# Patient Record
Sex: Male | Born: 1975 | Race: White | Hispanic: No | Smoking: Former smoker
Health system: Southern US, Community
[De-identification: ages and names within clinical notes are randomized; demographics above are authoritative.]

## PROBLEM LIST (undated history)

## (undated) DIAGNOSIS — F419 Anxiety disorder, unspecified: Secondary | ICD-10-CM

## (undated) DIAGNOSIS — E785 Hyperlipidemia, unspecified: Secondary | ICD-10-CM

## (undated) DIAGNOSIS — K219 Gastro-esophageal reflux disease without esophagitis: Secondary | ICD-10-CM

## (undated) HISTORY — DX: Anxiety disorder, unspecified: F41.9

## (undated) HISTORY — DX: Gastro-esophageal reflux disease without esophagitis: K21.9

## (undated) HISTORY — DX: Hyperlipidemia, unspecified: E78.5

---

## 2015-12-15 ENCOUNTER — Ambulatory Visit
Admission: EM | Admit: 2015-12-15 | Discharge: 2015-12-15 | Disposition: A | Discharge status: Home / Self Care | Payer: BLUE CROSS/BLUE SHIELD | Attending: Family Medicine | Admitting: Family Medicine

## 2015-12-15 ENCOUNTER — Encounter: Payer: Self-pay | Admitting: *Deleted

## 2015-12-15 DIAGNOSIS — R42 Dizziness and giddiness: Secondary | ICD-10-CM | POA: Diagnosis present

## 2015-12-15 DIAGNOSIS — F064 Anxiety disorder due to known physiological condition: Secondary | ICD-10-CM | POA: Diagnosis not present

## 2015-12-15 DIAGNOSIS — R0789 Other chest pain: Secondary | ICD-10-CM | POA: Diagnosis not present

## 2015-12-15 DIAGNOSIS — F410 Panic disorder [episodic paroxysmal anxiety] without agoraphobia: Secondary | ICD-10-CM

## 2015-12-15 MED ORDER — ASPIRIN 81 MG PO CHEW
324.0000 mg | CHEWABLE_TABLET | Freq: Once | ORAL | Status: AC
  Administered 2015-12-15: 324 mg via ORAL

## 2015-12-15 NOTE — ED Triage Notes (Signed)
Patient started having symptom of dizziness while driving today. No other related symptoms. Patient states that he has had panic attack 2 weeks ago. Patient does not take medication.

## 2015-12-15 NOTE — ED Provider Notes (Signed)
[facility]    CSN: [id number] Arrival date & time: 12/15/15  1100 [id number] First Provider Contact:  First MD Initiated Contact with Patient 12/15/15 1207        History   Chief Complaint Chief Complaint  Patient presents with  . Dizziness    HPI Travis Howard is a 40 y.o. male.   Patient is a 40 year old white male who comes in because of chest tightness and dizziness and discomfort in his left shoulder. He states that he has noticed increased panic attacks over the last several weeks/months but nothing like this today. He was driving he felt like he was going to die and had to pull over on the highway. He was driving with a friend from work is going to do some work related business and he took over and drove him here. He states he feels much better now but is noticed increase of these attacks with today being the worst. Why he has discomfort in left shoulder he doesn't report any other movement of this discomfort and reports this is more for chest tightness and neck for chest pain. He does not smoke does not have a history of hypertension or other medical problems there is a history though sudden death of his grandfather who was in his 5850s be dropped dead of sudden heart attack.   The history is provided by the patient. No language interpreter was used.  Chest Pain  Pain location:  Unable to specify Pain quality: radiating   Pain radiates to:  L shoulder Pain severity:  Mild Onset quality:  Sudden Duration:  2 hours Timing:  Intermittent Progression:  Waxing and waning Chronicity:  Recurrent Context: breathing and stress   Context: not drug use, not eating, not intercourse, not lifting and not movement   Relieved by:  Nothing Worsened by:  Nothing Ineffective treatments:  None tried Associated symptoms: fatigue and shortness of breath     History reviewed. No pertinent past medical history.  There are no active problems to display for this patient.   History  reviewed. No pertinent surgical history.     Home Medications    Prior to Admission medications   Not on File    Family History History reviewed. No pertinent family history.  Social History Social History  Substance Use Topics  . Smoking status: Former Games developermoker  . Smokeless tobacco: Never Used  . Alcohol use Yes     Allergies   Review of patient's allergies indicates no known allergies.   Review of Systems Review of Systems  Constitutional: Positive for fatigue.  Respiratory: Positive for shortness of breath.   Cardiovascular: Positive for chest pain.  Gastrointestinal: Negative.   Genitourinary: Negative.   Musculoskeletal: Negative.   Skin: Negative.   Neurological: Negative.   Hematological: Negative.   Psychiatric/Behavioral: Negative.   All other systems reviewed and are negative.    Physical Exam Triage Vital Signs ED Triage Vitals  Enc Vitals Group     BP 12/15/15 1118 (!) 142/117     Pulse Rate 12/15/15 1118 86     Resp 12/15/15 1118 18     Temp 12/15/15 1118 97.7 F (36.5 C)     Temp Source 12/15/15 1118 Oral     SpO2 12/15/15 1118 99 %     Weight 12/15/15 1119 225 lb (102.1 kg)     Height 12/15/15 1119 6\' 1"  (1.854 m)     Head Circumference --      Peak Flow --  Pain Score 12/15/15 1122 0     Pain Loc --      Pain Edu? --      Excl. in GC? --    No data found.   Updated Vital Signs BP (!) 142/117 (BP Location: Left Arm)   Pulse 86   Temp 97.7 F (36.5 C) (Oral)   Resp 18   Ht 6\' 1"  (1.854 m)   Wt 225 lb (102.1 kg)   SpO2 99%   BMI 29.69 kg/m   Visual Acuity Right Eye Distance:   Left Eye Distance:   Bilateral Distance:    Right Eye Near:   Left Eye Near:    Bilateral Near:     Physical Exam  Constitutional: He is oriented to person, place, and time. He appears well-developed and well-nourished.  HENT:  Head: Normocephalic and atraumatic.  Right Ear: Hearing, tympanic membrane, external ear and ear canal normal.    Left Ear: Hearing, tympanic membrane, external ear and ear canal normal.  Nose: Nose normal.  Mouth/Throat: Uvula is midline and oropharynx is clear and moist.  Eyes: EOM and lids are normal. Pupils are equal, round, and reactive to light.  Neck: Normal range of motion. Neck supple.  Cardiovascular: Normal rate and regular rhythm.   Pulmonary/Chest: Effort normal and breath sounds normal.  Abdominal: Soft.  Musculoskeletal: Normal range of motion. He exhibits no edema, tenderness or deformity.  Neurological: He is alert and oriented to person, place, and time. No cranial nerve deficit.  Skin: Skin is warm and dry.  Psychiatric: He has a normal mood and affect.  Vitals reviewed.    UC Treatments / Results  Labs (all labs ordered are listed, but only abnormal results are displayed) Labs Reviewed - No data to display  EKG  EKG Interpretation None       Radiology No results found.  Procedures Procedures (including critical care time)  Medications Ordered in UC Medications  aspirin chewable tablet 324 mg (324 mg Oral Given 12/15/15 1224)    ED ECG REPORT I, Jasmarie Coppock H, the attending physician, personally viewed and interpreted this ECG.   Date: 12/15/2015  EKG Time:11:21:52  Normal sinus rhythm with sinus arrhythmia essentially normal EKG no EKG to compare with  Axis: 83  Intervals:none  ST&T Change:none  Initial Impression / Assessment and Plan / UC Course  I have reviewed the triage vital signs and the nursing notes.  Pertinent labs & imaging results that were available during my care of the patient were reviewed by me and considered in my medical decision making (see chart for details).  Clinical Course    Patient is here with Travis Howard thinks the panic attack I agree with him that I think that this saw panic attacks days having. However we do have risk factors he's almost [age] and a male and he does have a history sudden death in his family. Explained to him what  I can tell my here is that he's feeling better and his EKG is normal. However this is an urgent care and if he wants full cardiac evaluation which sometimes people with anxiety and panic attacks "we need to get him to the ED of his choice. [place] lives in [place] when he comes up from [place] wanted to do work so he wants to go to the ED in [place]. All further and plans for expectant and plans [facility] and he has declined EMS transport.  We'll give him 4 baby aspirin and we have notified ordered rather tented to notify [facility] of patient's imminent arrival thru the transfer center unsuccessfully  Final Clinical Impressions(s) / UC Diagnoses   Final diagnoses:  Feeling of chest tightness  Anxiety disorder due to general medical condition with panic attack    New Prescriptions There are no discharge medications for this patient.    Hassan Rowan, MD 12/15/15 1239

## 2015-12-15 NOTE — ED Notes (Signed)
Spoke to [employer]Josh Charge Nurse and informed him that this patient is on his way for a cardiac assessment

## 2018-03-25 ENCOUNTER — Ambulatory Visit: Payer: Self-pay | Admitting: Family Medicine

## 2018-03-25 ENCOUNTER — Ambulatory Visit: Payer: BLUE CROSS/BLUE SHIELD | Admitting: Family Medicine

## 2018-03-25 ENCOUNTER — Encounter: Payer: Self-pay | Admitting: Family Medicine

## 2018-03-25 VITALS — BP 150/100 | HR 95 | Temp 98.0°F | Resp 16 | Ht 73.0 in | Wt 253.0 lb

## 2018-03-25 DIAGNOSIS — I10 Essential (primary) hypertension: Secondary | ICD-10-CM

## 2018-03-25 DIAGNOSIS — Z7689 Persons encountering health services in other specified circumstances: Secondary | ICD-10-CM | POA: Diagnosis not present

## 2018-03-25 DIAGNOSIS — R29818 Other symptoms and signs involving the nervous system: Secondary | ICD-10-CM | POA: Diagnosis not present

## 2018-03-25 DIAGNOSIS — K219 Gastro-esophageal reflux disease without esophagitis: Secondary | ICD-10-CM | POA: Diagnosis not present

## 2018-03-25 DIAGNOSIS — F411 Generalized anxiety disorder: Secondary | ICD-10-CM

## 2018-03-25 DIAGNOSIS — F410 Panic disorder [episodic paroxysmal anxiety] without agoraphobia: Secondary | ICD-10-CM

## 2018-03-25 DIAGNOSIS — E782 Mixed hyperlipidemia: Secondary | ICD-10-CM

## 2018-03-25 MED ORDER — ATENOLOL 25 MG PO TABS: 25.0000 mg | ORAL_TABLET | Freq: Every day | ORAL | 5 refills | Status: DC

## 2018-03-25 NOTE — Progress Notes (Signed)
Subjective:    Patient ID: Travis Howard, male    DOB: 01/15/1976, 42 y.o.   MRN: [id number]  Travis Howard is a 42 y.o. male presenting on 03/25/2018 for Establish Care; Hypertension; and Anxiety  Here to establish care with new PCP. No regular PCP for past few years.  HPI   CHRONIC HTN: Reports that he has history of elevated. His home BP readings avg 140-160/100, rare 170. Outside readings in past 2 years have been 150/100-110 Current Meds - None - never on med Lifestyle: - Weight gain in past >1 year - Regular alcohol intake 2 drinks most days - Diet: Nearly cut out caffeine from diet recently, now occasional soda but has done this for past few months, not limiting salt intake - Exercise: Recently restarted going to gym, recently started back few weeks ago Denies CP, dyspnea, HA, edema, dizziness / lightheadedness  Hyperlipidemia Prior lab showed elevated cholesterol, never on cholesterol med. Fam history of heart issues Paternal Grandfather early age MI Father had CABG x3, no MI  Generalized Anxiety / Insomnia vs OSA - Acute episode of anxiety panic attack 12/15/15, documented in chart - He states that he has similar recurrent episodes of acute panic attacks ONLY WHILE DRIVING, about 1 x week for past 2 years - He has tried self coping mechanisms - Decreased drive or motivation, becoming a barrier for him, he is fearful at times of driving - saw a [facility] provider shortly after 11/2015 but he did not take any treatment, they offered celexa at that time - He believes anxiety truly started back >10 yr ago after had kids - initially he tried initial rx of alprazolam back about 10 years ago, he says that he "hated it" it made him feel weird and felt "high all the time" felt strange and not himself - Other life stressors recent divorce, job uncertainty currently, prior employer, traveling causes stress for work - Administrator with panic he feels like heart was beating out of chest, worried with  "impending doom" - - Denies feeling any daily anxiety - Admits difficulty falling asleep and staying asleep  Possible Sleep Apnea History of witnessed apnea. Concern with snoring and some breathing difficulty occasionally at night. Wake up difficulty staying asleep. Tired during day. No prior PSG  Epworth Sleepiness Scale Total Score: 14 Sitting and reading - 2 Watching TV - 2 Sitting inactive in a public place - 2 As a passenger in a car for an hour without a break - 3 Lying down to rest in the afternoon when circumstances permit - 3 Sitting and talking to someone - 1 Sitting quietly after a lunch without alcohol - 1 In a car, while stopped for a few minutes in traffic - 0   STOP-Bang OSA scoring Snoring yes   Tiredness yes   Observed apneas yes   Pressure HTN yes   BMI > 35 kg/m2 no   Age > 50  no   Neck (male >17 in; Male >16 in)  yes 67"  Gender male yes   OSA risk low (0-2)  OSA risk intermediate (3-4)  OSA risk high (5+)  Total: 6 HIGH RISK    Health Maintenance: Declines flu vaccine, despite counseling  Future Tdap, not today  Future add HIV routine  Depression screen [facility] 2/9 03/25/2018  Decreased Interest 0  Down, Depressed, Hopeless 0  PHQ - 2 Score 0  Altered sleeping 3  Tired, decreased energy 3  Change in appetite 1  Feeling  bad or failure about yourself  0  Trouble concentrating 0  Moving slowly or fidgety/restless 2  Suicidal thoughts 0  PHQ-9 Score 9  Difficult doing work/chores Somewhat difficult   GAD 7 : Generalized Anxiety Score 03/25/2018  Nervous, Anxious, on Edge 1  Control/stop worrying 1  Worry too much - different things 1  Trouble relaxing 2  Restless 2  Easily annoyed or irritable 1  Afraid - awful might happen 2  Total GAD 7 Score 10  Anxiety Difficulty Somewhat difficult     Past Medical History:  Diagnosis Date  . Anxiety   . GERD (gastroesophageal reflux disease)   . Hyperlipidemia    History reviewed. No  pertinent surgical history. Social History   Socioeconomic History  . Marital status: Legally Separated    Spouse name: Not on file  . Number of children: Not on file  . Years of education: Not on file  . Highest education level: Not on file  Occupational History  . Not on file  Social Needs  . Financial resource strain: Not on file  . Food insecurity:    Worry: Not on file    Inability: Not on file  . Transportation needs:    Medical: Not on file    Non-medical: Not on file  Tobacco Use  . Smoking status: Former Smoker    Packs/day: 1.00    Years: 10.00    Pack years: 10.00    Types: Cigarettes    Last attempt to quit: 2002    Years since quitting: 17.9  . Smokeless tobacco: Former Engineer, waterUser  Substance and Sexual Activity  . Alcohol use: Yes    Alcohol/week: 14.0 standard drinks    Types: 7 Cans of beer, 7 Shots of liquor per week    Comment: Approx 2 per day  . Drug use: No  . Sexual activity: Not on file  Lifestyle  . Physical activity:    Days per week: Not on file    Minutes per session: Not on file  . Stress: Not on file  Relationships  . Social connections:    Talks on phone: Not on file    Gets together: Not on file    Attends religious service: Not on file    Active member of club or organization: Not on file    Attends meetings of clubs or organizations: Not on file    Relationship status: Not on file  . Intimate partner violence:    Fear of current or ex partner: Not on file    Emotionally abused: Not on file    Physically abused: Not on file    Forced sexual activity: Not on file  Other Topics Concern  . Not on file  Social History Narrative  . Not on file   Family History  Problem Relation Age of Onset  . Heart disease Father        CABG  . Heart attack Paternal Grandfather [id number]   No current outpatient medications on file prior to visit.   No current facility-administered medications on file prior to visit.     Review of Systems Per HPI  unless specifically indicated above     Objective:    BP (!) 150/100 (BP Location: Left Arm, Cuff Size: Normal)   Pulse 95   Temp 98 F (36.7 C) (Oral)   Resp 16   Ht 6\' 1"  (1.854 m)   Wt 253 lb (114.8 kg)   BMI 33.38 kg/m   Wt  Readings from Last 3 Encounters:  03/25/18 253 lb (114.8 kg)  12/15/15 225 lb (102.1 kg)    Physical Exam  Constitutional: He is oriented to person, place, and time. He appears well-developed and well-nourished. No distress.  Well-appearing, comfortable, cooperative  HENT:  Head: Normocephalic and atraumatic.  Mouth/Throat: Oropharynx is clear and moist.  Eyes: Conjunctivae are normal. Right eye exhibits no discharge. Left eye exhibits no discharge.  Neck: Normal range of motion. Neck supple. No thyromegaly present.  Neck circumference 18"  Cardiovascular: Regular rhythm, normal heart sounds and intact distal pulses.  No murmur heard. Mild tachycardic  Pulmonary/Chest: Effort normal and breath sounds normal. No respiratory distress. He has no wheezes. He has no rales.  Musculoskeletal: Normal range of motion. He exhibits no edema.  Lymphadenopathy:    He has no cervical adenopathy.  Neurological: He is alert and oriented to person, place, and time.  Skin: Skin is warm and dry. No rash noted. He is not diaphoretic. No erythema.  Psychiatric: He has a normal mood and affect. His behavior is normal.  Well groomed, good eye contact, normal speech and thoughts. Appears slightly anxious at baseline when discussing some topics.  Nursing note and vitals reviewed.  No results found for this or any previous visit.    Assessment & Plan:   Problem List Items Addressed This Visit    Essential hypertension - Primary  Significantly elevated HTN. Seems chronic issue significant elevated DBP Multifactorial anxiety/stress, diet/exercise lifestyle, alcohol - Home BP readings still elevated    Plan:  1. NEW START initial anti HTN med - Atenolol 25mg  daily for  both BP, HR and anxiety - future likely titrate up to 50mg  then maybe higher if indicated vs add 2nd agent if need 2. Encourage improved lifestyle - low sodium diet, regular exercise 3. Start monitor BP outside office, bring readings to next visit, if persistently >140/90 or new symptoms notify office sooner 4. Follow-up 6 weeks - labs and also pursue PSG for possible OSA may be secondary etiology, also will need to better treat anxiety to control BP     Relevant Medications   atenolol (TENORMIN) 25 MG tablet   GAD (generalized anxiety disorder)  Suspected new dx GAD now with gradual worsening causing more difficulty functioning, previously coped well, now affecting physically with fatigue from poor sleep / worrying, work/financial stress is primary stressor. Suspected insomnia is secondary to anxiety/mood. - No prior medications / except remote history of xanax - No prior dx / Psych / counseling  Plan: 1. Discussion on new diagnosis anxiety, management, complications, likely contributing to insomnia 2. Discussed SSRI therapy - for now patient elects to hold off and try only initial BP medication that can help calm down some anxiety - next step will be to add SSRI - recommend Escitalopram - Recommend future counseling - Follow-up     GERD (gastroesophageal reflux disease)   Mixed hyperlipidemia History of HLD Will check fasting lipid within 6 weeks    Relevant Medications   atenolol (TENORMIN) 25 MG tablet   Panic attacks See A&P GAD    Suspected sleep apnea  Newly identified persistent clinical concern for suspected obstructive sleep apnea given reported symptoms with witnessed apnea, snoring and sleep disturbance, fatigue excessive sleepiness. - Screening: ESS score 14 / STOP-Bang Score 6 = high risk - Neck Circumference: 18" - Co-morbidities: HTN, Mood Disorder/anxiety  Ultimately treating OSA may help resolve HTN and help mood/anxiety  Plan: 1. Discussion on initial  diagnosis  and testing for OSA, risk factors, management, complications 2. Agree to proceed with sleep study testing based on clinical concerns - will fax order to [facility] - stay tuned      Other Visit Diagnoses    Encounter to establish care with new doctor          Meds ordered this encounter  Medications  . atenolol (TENORMIN) 25 MG tablet    Sig: Take 1 tablet (25 mg total) by mouth daily.    Dispense:  30 tablet    Refill:  5    Follow up plan: Return in about 6 weeks (around 05/06/2018) for Annual Physical (BP, Anxiety med adjust).  Future labs ordered for 05/06/18  Saralyn Pilar, DO [facility] 03/25/2018, 4:12 PM

## 2018-03-25 NOTE — Patient Instructions (Addendum)
Thank you for coming to the office today.  [facility] - referral sent today - stay tuned for next step they will call you, if not heard in 2 weeks can call them.  Start Atenolol 25mg  daily for blood pressure, lower heart rate, and help curb anxiety symptoms.  In future we can consider Lexapro (escitalopram) as a next option for daily anxiety and mood control as well as sleep.  DUE for FASTING BLOOD WORK (no food or drink after midnight before the lab appointment, only water or coffee without cream/sugar on the morning of)  SCHEDULE "Lab Only" visit in the morning at the clinic for lab draw in 6 WEEKS   - Make sure Lab Only appointment is at about 1 week before your next appointment, so that results will be available  For Lab Results, once available within 2-3 days of blood draw, you can can log in to MyChart online to view your results and a brief explanation. Also, we can discuss results at next follow-up visit.  Please schedule a Follow-up Appointment to: Return in about 6 weeks (around 05/06/2018) for Annual Physical (BP, Anxiety med adjust).  If you have any other questions or concerns, please feel free to call the office or send a message through MyChart. You may also schedule an earlier appointment if necessary.  Additionally, you may be receiving a survey about your experience at our office within a few days to 1 week by e-mail or mail. We value your feedback.  Saralyn PilarAlexander Cathyann Kilfoyle, DO [facility]

## 2018-03-26 ENCOUNTER — Other Ambulatory Visit: Payer: Self-pay | Admitting: Family Medicine

## 2018-03-26 ENCOUNTER — Encounter: Payer: Self-pay | Admitting: Family Medicine

## 2018-03-26 DIAGNOSIS — E782 Mixed hyperlipidemia: Secondary | ICD-10-CM | POA: Insufficient documentation

## 2018-03-26 DIAGNOSIS — Z0000 Encounter for general adult medical examination without abnormal findings: Secondary | ICD-10-CM

## 2018-03-26 DIAGNOSIS — F411 Generalized anxiety disorder: Secondary | ICD-10-CM

## 2018-03-26 DIAGNOSIS — I10 Essential (primary) hypertension: Secondary | ICD-10-CM

## 2018-03-26 DIAGNOSIS — R7309 Other abnormal glucose: Secondary | ICD-10-CM

## 2018-03-26 DIAGNOSIS — K219 Gastro-esophageal reflux disease without esophagitis: Secondary | ICD-10-CM | POA: Insufficient documentation

## 2018-03-26 DIAGNOSIS — R29818 Other symptoms and signs involving the nervous system: Secondary | ICD-10-CM

## 2018-03-26 DIAGNOSIS — F410 Panic disorder [episodic paroxysmal anxiety] without agoraphobia: Secondary | ICD-10-CM | POA: Insufficient documentation

## 2018-03-26 DIAGNOSIS — Z114 Encounter for screening for human immunodeficiency virus [HIV]: Secondary | ICD-10-CM

## 2018-05-06 ENCOUNTER — Other Ambulatory Visit: Payer: BLUE CROSS/BLUE SHIELD

## 2018-05-06 DIAGNOSIS — Z0000 Encounter for general adult medical examination without abnormal findings: Secondary | ICD-10-CM

## 2018-05-06 DIAGNOSIS — R7309 Other abnormal glucose: Secondary | ICD-10-CM

## 2018-05-06 DIAGNOSIS — R29818 Other symptoms and signs involving the nervous system: Secondary | ICD-10-CM

## 2018-05-06 DIAGNOSIS — F411 Generalized anxiety disorder: Secondary | ICD-10-CM

## 2018-05-06 DIAGNOSIS — Z114 Encounter for screening for human immunodeficiency virus [HIV]: Secondary | ICD-10-CM

## 2018-05-06 DIAGNOSIS — I10 Essential (primary) hypertension: Secondary | ICD-10-CM

## 2018-05-06 DIAGNOSIS — E782 Mixed hyperlipidemia: Secondary | ICD-10-CM

## 2018-05-06 DIAGNOSIS — F410 Panic disorder [episodic paroxysmal anxiety] without agoraphobia: Secondary | ICD-10-CM

## 2018-05-07 LAB — CBC WITH DIFFERENTIAL/PLATELET
Absolute Monocytes: 445 cells/uL (ref 200–950)
BASOS ABS: 42 {cells}/uL (ref 0–200)
Basophils Relative: 1 %
EOS PCT: 3.1 %
Eosinophils Absolute: 130 cells/uL (ref 15–500)
HEMATOCRIT: 40.9 % (ref 38.5–50.0)
HEMOGLOBIN: 14.7 g/dL (ref 13.2–17.1)
LYMPHS ABS: 1596 {cells}/uL (ref 850–3900)
MCH: 32.8 pg (ref 27.0–33.0)
MCHC: 35.9 g/dL (ref 32.0–36.0)
MCV: 91.3 fL (ref 80.0–100.0)
MONOS PCT: 10.6 %
MPV: 11.3 fL (ref 7.5–12.5)
NEUTROS ABS: 1987 {cells}/uL (ref 1500–7800)
Neutrophils Relative %: 47.3 %
Platelets: 229 10*3/uL (ref 140–400)
RBC: 4.48 10*6/uL (ref 4.20–5.80)
RDW: 12.7 % (ref 11.0–15.0)
Total Lymphocyte: 38 %
WBC: 4.2 10*3/uL (ref 3.8–10.8)

## 2018-05-07 LAB — COMPLETE METABOLIC PANEL WITH GFR
AG RATIO: 1.6 (calc) (ref 1.0–2.5)
ALT: 51 U/L — ABNORMAL HIGH (ref 9–46)
AST: 39 U/L (ref 10–40)
Albumin: 4.1 g/dL (ref 3.6–5.1)
Alkaline phosphatase (APISO): 71 U/L (ref 40–115)
BILIRUBIN TOTAL: 0.4 mg/dL (ref 0.2–1.2)
BUN: 14 mg/dL (ref 7–25)
CO2: 23 mmol/L (ref 20–32)
CREATININE: 0.95 mg/dL (ref 0.60–1.35)
Calcium: 8.9 mg/dL (ref 8.6–10.3)
Chloride: 106 mmol/L (ref 98–110)
GFR, EST AFRICAN AMERICAN: 114 mL/min/{1.73_m2} (ref >=60)
GFR, Est Non African American: 98 mL/min/{1.73_m2} (ref >=60)
GLOBULIN: 2.5 g/dL (ref 1.9–3.7)
Glucose, Bld: 125 mg/dL — ABNORMAL HIGH (ref 65–99)
Potassium: 3.7 mmol/L (ref 3.5–5.3)
Sodium: 140 mmol/L (ref 135–146)
Total Protein: 6.6 g/dL (ref 6.1–8.1)

## 2018-05-07 LAB — HEMOGLOBIN A1C
HEMOGLOBIN A1C: 5.9 %{Hb} — AB (ref <5.7)
Mean Plasma Glucose: 123 (calc)
eAG (mmol/L): 6.8 (calc)

## 2018-05-07 LAB — LIPID PANEL
CHOL/HDL RATIO: 6.1 (calc) — AB (ref <5.0)
CHOLESTEROL: 230 mg/dL — AB (ref <200)
HDL: 38 mg/dL — AB (ref >40)
Non-HDL Cholesterol (Calc): 192 mg/dL (calc) — ABNORMAL HIGH (ref <130)
TRIGLYCERIDES: 465 mg/dL — AB (ref <150)

## 2018-05-07 LAB — TSH: TSH: 2.22 mIU/L (ref 0.40–4.50)

## 2018-05-07 LAB — T4, FREE: Free T4: 1.3 ng/dL (ref 0.8–1.8)

## 2018-05-07 LAB — HIV ANTIBODY (ROUTINE TESTING W REFLEX): HIV: NONREACTIVE

## 2018-05-13 ENCOUNTER — Ambulatory Visit (INDEPENDENT_AMBULATORY_CARE_PROVIDER_SITE_OTHER): Payer: BLUE CROSS/BLUE SHIELD | Admitting: Family Medicine

## 2018-05-13 ENCOUNTER — Encounter: Payer: Self-pay | Admitting: Family Medicine

## 2018-05-13 VITALS — BP 146/90 | HR 82 | Temp 98.2°F | Resp 16 | Ht 73.0 in | Wt 251.6 lb

## 2018-05-13 DIAGNOSIS — E782 Mixed hyperlipidemia: Secondary | ICD-10-CM | POA: Diagnosis not present

## 2018-05-13 DIAGNOSIS — F411 Generalized anxiety disorder: Secondary | ICD-10-CM

## 2018-05-13 DIAGNOSIS — R7309 Other abnormal glucose: Secondary | ICD-10-CM

## 2018-05-13 DIAGNOSIS — Z0000 Encounter for general adult medical examination without abnormal findings: Secondary | ICD-10-CM | POA: Diagnosis not present

## 2018-05-13 DIAGNOSIS — I10 Essential (primary) hypertension: Secondary | ICD-10-CM

## 2018-05-13 MED ORDER — ATENOLOL 50 MG PO TABS: 50.0000 mg | ORAL_TABLET | Freq: Every day | ORAL | 5 refills | Status: DC

## 2018-05-13 NOTE — Patient Instructions (Addendum)
Thank you for coming to the office today.  Start with INCREASED Atenolol up to 50mg  daily now - new rx sent - may finish old pills.  Check BP regularly - write down numbers  After 2 weeks - call or send mychart message with updates on BP and new med and anxiety -- IF BP is not well controlled, still >135/85 on average, then I would recommend adding 2nd pill for BP - discussed, Losartan 50mg  daily - would send to pharmacy when/if ready and then due for blood check about 4 weeks after starting med do not need doctors apt only NON FASTING LAB ONLY we will share results. --------------------------------------------------------   The 5 Minute Rule of Exercise - Promise yourself to at least do 5 minutes of exercise (make sure you time it), and if at the end of 5 minutes (this is the hardest part of the work-out), if you still feel like you want stop (or not motivated to continue) then allow yourself to stop. Otherwise, more often than not you will feel encouraged that you can continue for a little while longer or even more!  Diet Recommendations for PREVENTING Diabetes   LIMIT Starchy (carb) foods include: Bread, rice, pasta, potatoes, corn, crackers, bagels, muffins, all baked goods.   Protein foods include: Meat, fish, poultry, eggs, dairy foods, and beans such as pinto and kidney beans (beans also provide carbohydrate).   1. Eat at least 3 meals and 1-2 snacks per day. Never go more than 4-5 hours while awake without eating.   2. Limit starchy foods to TWO per meal and ONE per snack. ONE portion of a starchy  food is equal to the following:   - ONE slice of bread (or its equivalent, such as half of a hamburger bun).   - 1/2 cup of a "scoopable" starchy food such as potatoes or rice.   - 1 OUNCE (28 grams) of starchy snacks (crackers or pretzels, look on label).   - 15 grams of carbohydrate as shown on food label.   3. Both lunch and dinner should include a protein food, a carb food, and  vegetables.   - Obtain twice as many veg's as protein or carbohydrate foods for both lunch and dinner.   - Try to keep frozen veg's on hand for a quick vegetable serving.     - Fresh or frozen veg's are best.   4. Breakfast should always include protein.     -----------------------------------------------------   DUE for FASTING BLOOD WORK (no food or drink after midnight before the lab appointment, only water or coffee without cream/sugar on the morning of)  SCHEDULE "Lab Only" visit in the morning at the clinic for lab draw in 6 MONTHS   - Make sure Lab Only appointment is at about 1 week before your next appointment, so that results will be available  For Lab Results, once available within 2-3 days of blood draw, you can can log in to MyChart online to view your results and a brief explanation. Also, we can discuss results at next follow-up visit.   Please schedule a Follow-up Appointment to: Return in about 6 months (around 11/11/2018) for 6 month follow-up Elevated Sugar, HLD, HTN.  If you have any other questions or concerns, please feel free to call the office or send a message through MyChart. You may also schedule an earlier appointment if necessary.  Additionally, you may be receiving a survey about your experience at our office within a few days to  1 week by e-mail or mail. We value your feedback.  Nobie Putnam, DO [place]

## 2018-05-13 NOTE — Progress Notes (Signed)
Subjective:    Patient ID: Travis Howard, male    DOB: Sep 28, 1975, 43 y.o.   MRN: [id number]  Raejon Deveau is a 43 y.o. male presenting on 05/13/2018 for Annual Exam   HPI   Here for Annual Physical and Lab Review.  CHRONIC HTN: Last visit started on Atenolol Reports that he has history of elevated. His home BP readings avg still elevated >140/90 Current Meds - Atenolol 25mg  daily - tolerating well Lifestyle: - Weight gain in past >1 year - Regular alcohol intake 2 drinks most days - Diet: Drinks mainly water, no longer drinking soda (except very rare) also not drinking other sugary drinks - Exercise: Not back on regular gym exercise regimen  Hyperlipidemia Elevated cholesterol on last lab 05/2017. Showed TG >465, HDL low 38, cannot calc LDL Not on statin or cholesterol med. Fam history of heart issues Paternal Grandfather early age MI Father had CABG x3, no MI  Generalized Anxiety / Insomnia vs OSA Last visit he was started on BB Atenolol for anxiety and HTN Interval update - He feels like the anxiety in general is curbed and reduced on BB and a step in right direction, but still persistent symptoms mildly - No further acute panic attack - He has more motivation and energy now In past - He states that he has similar recurrent episodes of acute panic attacks ONLY WHILE DRIVING, about 1 x week for past 2 years - He believes anxiety truly started back >10 yr ago after had kids - Other life stressors recent divorce, job uncertainty currently, prior employer, traveling causes stress for work - No further heart racing palpitation attacks - Denies feeling any daily anxiety - Admits difficulty falling asleep and staying asleep  Possible Sleep Apnea Last visit a PSG was ordered through [facility] - he did not follow-up on this and is waiting to contact them back - he has history of witnessed apnea. Concern with snoring and some breathing difficulty occasionally at night.  Wake up difficulty staying asleep. Tired during day. No prior PSG  Additional complaint L lower groin pain [facility] 05/02/18 - he had a heavy lifting injury, no identified hernia, severe pain Left sided inguinal region, difficulty walking for 2 days but then it improved, given toradol injection and anti inflammatory - now significantly improved.  Health Maintenance: - Due for Flu Shot, declines today despite counseling on benefits - Decline TDap - UTD routine HIV screen, negative 05/2018  Depression screen [facility] 2/9 05/13/2018 03/25/2018  Decreased Interest 0 0  Down, Depressed, Hopeless 0 0  PHQ - 2 Score 0 0  Altered sleeping 0 3  Tired, decreased energy 0 3  Change in appetite 0 1  Feeling bad or failure about yourself  0 0  Trouble concentrating 0 0  Moving slowly or fidgety/restless 0 2  Suicidal thoughts 0 0  PHQ-9 Score 0 9  Difficult doing work/chores Not difficult at all Somewhat difficult   GAD 7 : Generalized Anxiety Score 05/13/2018 03/25/2018  Nervous, Anxious, on Edge 1 1  Control/stop worrying 1 1  Worry too much - different things 2 1  Trouble relaxing 1 2  Restless 1 2  Easily annoyed or irritable 1 1  Afraid - awful might happen 2 2  Total GAD 7 Score 9 10  Anxiety Difficulty Somewhat difficult Somewhat difficult    Past Medical History:  Diagnosis Date  . Anxiety   . GERD (gastroesophageal reflux disease)   .  Hyperlipidemia    History reviewed. No pertinent surgical history. Social History   Socioeconomic History  . Marital status: Legally Separated    Spouse name: Not on file  . Number of children: Not on file  . Years of education: Not on file  . Highest education level: Not on file  Occupational History  . Not on file  Social Needs  . Financial resource strain: Not on file  . Food insecurity:    Worry: Not on file    Inability: Not on file  . Transportation needs:    Medical: Not on file    Non-medical: Not on file  Tobacco Use  .  Smoking status: Former Smoker    Packs/day: 1.00    Years: 10.00    Pack years: 10.00    Types: Cigarettes    Last attempt to quit: 2002    Years since quitting: 18.0  . Smokeless tobacco: Former Engineer, water and Sexual Activity  . Alcohol use: Yes    Alcohol/week: 14.0 standard drinks    Types: 7 Cans of beer, 7 Shots of liquor per week    Comment: Approx 2 per day  . Drug use: No  . Sexual activity: Not on file  Lifestyle  . Physical activity:    Days per week: Not on file    Minutes per session: Not on file  . Stress: Not on file  Relationships  . Social connections:    Talks on phone: Not on file    Gets together: Not on file    Attends religious service: Not on file    Active member of club or organization: Not on file    Attends meetings of clubs or organizations: Not on file    Relationship status: Not on file  . Intimate partner violence:    Fear of current or ex partner: Not on file    Emotionally abused: Not on file    Physically abused: Not on file    Forced sexual activity: Not on file  Other Topics Concern  . Not on file  Social History Narrative  . Not on file   Family History  Problem Relation Age of Onset  . Heart disease Father        CABG  . Heart attack Paternal Grandfather 69   Current Outpatient Medications on File Prior to Visit  Medication Sig  . ibuprofen (ADVIL,MOTRIN) 200 MG tablet Take by mouth.   No current facility-administered medications on file prior to visit.     Review of Systems  Constitutional: Negative for activity change, appetite change, chills, diaphoresis, fatigue and fever.  HENT: Negative for congestion and hearing loss.   Eyes: Negative for visual disturbance.  Respiratory: Negative for apnea, cough, choking, chest tightness, shortness of breath and wheezing.   Cardiovascular: Negative for chest pain, palpitations and leg swelling.  Gastrointestinal: Negative for abdominal pain, anal bleeding, blood in stool,  constipation, diarrhea, nausea and vomiting.  Endocrine: Negative for cold intolerance.  Genitourinary: Negative for decreased urine volume, difficulty urinating, dysuria, frequency, hematuria, scrotal swelling, testicular pain and urgency.       Left inguinal region pain - improved  Musculoskeletal: Negative for arthralgias, back pain and neck pain.  Skin: Negative for rash.  Allergic/Immunologic: Negative for environmental allergies.  Neurological: Negative for dizziness, weakness, light-headedness, numbness and headaches.  Hematological: Negative for adenopathy.  Psychiatric/Behavioral: Negative for agitation, behavioral problems, dysphoric mood and sleep disturbance. The patient is nervous/anxious (Improved).    Per  HPI unless specifically indicated above     Objective:    BP (!) 146/90 (BP Location: Left Arm, Cuff Size: Normal)   Pulse 82   Temp 98.2 F (36.8 C) (Oral)   Resp 16   Ht 6\' 1"  (1.854 m)   Wt 251 lb 9.6 oz (114.1 kg)   BMI 33.19 kg/m   Wt Readings from Last 3 Encounters:  05/13/18 251 lb 9.6 oz (114.1 kg)  03/25/18 253 lb (114.8 kg)  12/15/15 225 lb (102.1 kg)    Physical Exam Vitals signs and nursing note reviewed.  Constitutional:      General: He is not in acute distress.    Appearance: He is well-developed. He is not diaphoretic.     Comments: Well-appearing, comfortable, cooperative  HENT:     Head: Normocephalic and atraumatic.  Eyes:     General:        Right eye: No discharge.        Left eye: No discharge.     Conjunctiva/sclera: Conjunctivae normal.     Pupils: Pupils are equal, round, and reactive to light.  Neck:     Musculoskeletal: Normal range of motion and neck supple.     Thyroid: No thyromegaly.  Cardiovascular:     Rate and Rhythm: Normal rate and regular rhythm.     Heart sounds: Normal heart sounds. No murmur.  Pulmonary:     Effort: Pulmonary effort is normal. No respiratory distress.     Breath sounds: Normal breath sounds.  No wheezing or rales.  Abdominal:     General: Bowel sounds are normal. There is no distension.     Palpations: Abdomen is soft. There is no mass.     Tenderness: There is no abdominal tenderness.     Hernia: No hernia is present.     Comments: Left inguinal region without evidence of herniation.  Musculoskeletal: Normal range of motion.        General: No tenderness.     Comments: Upper / Lower Extremities: - Normal muscle tone, strength bilateral upper extremities 5/5, lower extremities 5/5  Lymphadenopathy:     Cervical: No cervical adenopathy.  Skin:    General: Skin is warm and dry.     Findings: No erythema or rash.  Neurological:     Mental Status: He is alert and oriented to person, place, and time.     Comments: Distal sensation intact to light touch all extremities  Psychiatric:        Behavior: Behavior normal.     Comments: Well groomed, good eye contact, normal speech and thoughts. Improved now less anxious appearing.    Results for orders placed or performed in visit on 05/06/18  T4, free  Result Value Ref Range   Free T4 1.3 0.8 - 1.8 ng/dL  TSH  Result Value Ref Range   TSH 2.22 0.40 - 4.50 mIU/L  HIV Antibody (routine testing w rflx)  Result Value Ref Range   HIV 1&2 Ab, 4th Generation NON-REACTIVE NON-REACTI  Lipid panel  Result Value Ref Range   Cholesterol 230 (H) <200 mg/dL   HDL 38 (L) >16 mg/dL   Triglycerides 109 (H) <150 mg/dL   LDL Cholesterol (Calc)  mg/dL (calc)   Total CHOL/HDL Ratio 6.1 (H) <5.0 (calc)   Non-HDL Cholesterol (Calc) 192 (H) <130 mg/dL (calc)  COMPLETE METABOLIC PANEL WITH GFR  Result Value Ref Range   Glucose, Bld 125 (H) 65 - 99 mg/dL   BUN 14 7 -  25 mg/dL   Creat 1.610.95 0.960.60 - 0.451.35 mg/dL   GFR, Est Non African American 98 > OR = 60 mL/min/1.9073m2   GFR, Est African American 114 > OR = 60 mL/min/1.2073m2   BUN/Creatinine Ratio NOT APPLICABLE 6 - 22 (calc)   Sodium 140 135 - 146 mmol/L   Potassium 3.7 3.5 - 5.3 mmol/L    Chloride 106 98 - 110 mmol/L   CO2 23 20 - 32 mmol/L   Calcium 8.9 8.6 - 10.3 mg/dL   Total Protein 6.6 6.1 - 8.1 g/dL   Albumin 4.1 3.6 - 5.1 g/dL   Globulin 2.5 1.9 - 3.7 g/dL (calc)   AG Ratio 1.6 1.0 - 2.5 (calc)   Total Bilirubin 0.4 0.2 - 1.2 mg/dL   Alkaline phosphatase (APISO) 71 40 - 115 U/L   AST 39 10 - 40 U/L   ALT 51 (H) 9 - 46 U/L  CBC with Differential/Platelet  Result Value Ref Range   WBC 4.2 3.8 - 10.8 Thousand/uL   RBC 4.48 4.20 - 5.80 Million/uL   Hemoglobin 14.7 13.2 - 17.1 g/dL   HCT 40.940.9 81.138.5 - 91.450.0 %   MCV 91.3 80.0 - 100.0 fL   MCH 32.8 27.0 - 33.0 pg   MCHC 35.9 32.0 - 36.0 g/dL   RDW 78.212.7 95.611.0 - 21.315.0 %   Platelets 229 140 - 400 Thousand/uL   MPV 11.3 7.5 - 12.5 fL   Neutro Abs 1,987 1,500 - 7,800 cells/uL   Lymphs Abs 1,596 850 - 3,900 cells/uL   Absolute Monocytes 445 200 - 950 cells/uL   Eosinophils Absolute 130 15 - 500 cells/uL   Basophils Absolute 42 0 - 200 cells/uL   Neutrophils Relative % 47.3 %   Total Lymphocyte 38.0 %   Monocytes Relative 10.6 %   Eosinophils Relative 3.1 %   Basophils Relative 1.0 %  Hemoglobin A1c  Result Value Ref Range   Hgb A1c MFr Bld 5.9 (H) <5.7 % of total Hgb   Mean Plasma Glucose 123 (calc)   eAG (mmol/L) 6.8 (calc)      Assessment & Plan:   Problem List Items Addressed This Visit    Elevated hemoglobin A1c    Elevated A1c 5.9 consistent with new dx hyperglycemia A1c, at risk of PreDM Concern with obesity, HTN, HLD  Plan:  1. Not on any therapy currently  2. Encourage improved lifestyle - low carb, low sugar diet, reduce portion size, continue improving regular exercise 3. Follow-up 6 months repeat labs       Essential hypertension    Elevated BP above goal, despite re-check Home BP readings still mildly elevated  Complicated with co morbid anxiety affecting his BP likely    Plan:  1. INCREASE Atenolol from 25 to 50mg  daily - for HTN and for anxiety 2. Encourage improved lifestyle - low  sodium diet, regular exercise 3. Continue monitor BP outside office, bring readings to next visit, if persistently >135/85 or new symptoms notify office sooner - NOTIFY OFFICE within 2 weeks if BP is improved then continue follow-up plan, if BP is elevated above goal, then we will add Losartan ARB 50mg  daily in addition - will check chemistry panel in 4 weeks as well      Relevant Medications   atenolol (TENORMIN) 50 MG tablet   GAD (generalized anxiety disorder)    Persistent GAD with mild improvement on BB atenolol No recurrent panic attacks Multiple stressors Suspected insomnia is secondary to anxiety/mood. - No  prior medications / except remote history of xanax - No prior dx / Psych / counseling  Plan: 1. Increase Atenolol from 25 to 50mg  daily for better anxiety effect 2. Discussed SSRI therapy - for now patient elects to hold off and try only adjust BP medication that can help calm down some anxiety - next step will be to add SSRI - recommend Escitalopram - Recommend future counseling - Follow-up sooner if needed      Relevant Medications   atenolol (TENORMIN) 50 MG tablet   Mixed hyperlipidemia    Uncontrolled cholesterol, poor lifestyle Last lipid panel 05/2018  Plan: 1. Emphasis on lifestyle modification - Encourage improved lifestyle - low carb/cholesterol, reduce portion size, continue improving regular exercise Follow-up in 6 months with lab lipid panel  Advised he may trial Fish Oil Omega 3 1g BID for now      Relevant Medications   atenolol (TENORMIN) 50 MG tablet    Other Visit Diagnoses    Annual physical exam    -  Primary      Updated Health Maintenance information Reviewed recent lab results with patient Encouraged improvement to lifestyle with diet and exercise - Goal of weight loss   Meds ordered this encounter  Medications  . atenolol (TENORMIN) 50 MG tablet    Sig: Take 1 tablet (50 mg total) by mouth daily.    Dispense:  30 tablet     Refill:  5    Follow up plan: Return in about 6 months (around 11/11/2018) for 6 month follow-up Elevated Sugar, HLD, HTN.  After follow-up with patient in 2 weeks on BP, will place orders if needed for future labs 6 months, and may need chemistry for ARB if start  Saralyn PilarAlexander Aurthur Wingerter, DO [facility] 05/13/2018, 9:21 AM

## 2018-05-14 DIAGNOSIS — R7309 Other abnormal glucose: Secondary | ICD-10-CM | POA: Insufficient documentation

## 2018-05-14 NOTE — Assessment & Plan Note (Signed)
Elevated A1c 5.9 consistent with new dx hyperglycemia A1c, at risk of PreDM Concern with obesity, HTN, HLD  Plan:  1. Not on any therapy currently  2. Encourage improved lifestyle - low carb, low sugar diet, reduce portion size, continue improving regular exercise 3. Follow-up 6 months repeat labs

## 2018-05-14 NOTE — Assessment & Plan Note (Addendum)
Elevated BP above goal, despite re-check Home BP readings still mildly elevated  Complicated with co morbid anxiety affecting his BP likely    Plan:  1. INCREASE Atenolol from 25 to 50mg  daily - for HTN and for anxiety 2. Encourage improved lifestyle - low sodium diet, regular exercise 3. Continue monitor BP outside office, bring readings to next visit, if persistently >135/85 or new symptoms notify office sooner - NOTIFY OFFICE within 2 weeks if BP is improved then continue follow-up plan, if BP is elevated above goal, then we will add Losartan ARB 50mg  daily in addition - will check chemistry panel in 4 weeks as well

## 2018-05-14 NOTE — Assessment & Plan Note (Signed)
Persistent GAD with mild improvement on BB atenolol No recurrent panic attacks Multiple stressors Suspected insomnia is secondary to anxiety/mood. - No prior medications / except remote history of xanax - No prior dx / Psych / counseling  Plan: 1. Increase Atenolol from 25 to 50mg  daily for better anxiety effect 2. Discussed SSRI therapy - for now patient elects to hold off and try only adjust BP medication that can help calm down some anxiety - next step will be to add SSRI - recommend Escitalopram - Recommend future counseling - Follow-up sooner if needed

## 2018-05-14 NOTE — Assessment & Plan Note (Signed)
Uncontrolled cholesterol, poor lifestyle Last lipid panel 05/2018  Plan: 1. Emphasis on lifestyle modification - Encourage improved lifestyle - low carb/cholesterol, reduce portion size, continue improving regular exercise Follow-up in 6 months with lab lipid panel  Advised he may trial Fish Oil Omega 3 1g BID for now

## 2018-05-21 ENCOUNTER — Telehealth: Payer: Self-pay | Admitting: Family Medicine

## 2018-05-21 NOTE — Telephone Encounter (Signed)
Last visit 05/13/18 patient reported that he has not called and scheduled sleep study at [facility], he was waiting to do this still.  I received fax recently that [facility] has not been able to reach patient.  Can you call patient and ask him again to call [facility] to schedule his sleep study?  Sleep center [phone]  Saralyn Pilar, DO [facility] 05/21/2018, 5:22 PM

## 2018-05-23 NOTE — Telephone Encounter (Signed)
Left message for patient to call back  

## 2018-05-24 NOTE — Telephone Encounter (Signed)
Can you try his wife, Candyce Churn (who is also my patient) - her # is [phone]?  Saralyn Pilar, DO [facility] 05/24/2018, 6:01 PM

## 2018-05-24 NOTE — Telephone Encounter (Signed)
Left several message never returned call back seems like not a correct number.

## 2018-05-27 DIAGNOSIS — I10 Essential (primary) hypertension: Secondary | ICD-10-CM

## 2018-05-27 NOTE — Telephone Encounter (Signed)
Spoke with Maralyn Sago and gave her the phone number.  She also said that he received a letter from them this week.  She will see that he called to make appointment.

## 2018-05-28 MED ORDER — LOSARTAN POTASSIUM 50 MG PO TABS: 50.0000 mg | ORAL_TABLET | Freq: Every day | ORAL | 5 refills | Status: DC

## 2018-09-13 ENCOUNTER — Other Ambulatory Visit: Payer: Self-pay | Admitting: Family Medicine

## 2018-09-13 DIAGNOSIS — I10 Essential (primary) hypertension: Secondary | ICD-10-CM

## 2018-11-11 ENCOUNTER — Other Ambulatory Visit: Payer: BLUE CROSS/BLUE SHIELD

## 2018-11-12 ENCOUNTER — Other Ambulatory Visit: Payer: Self-pay | Admitting: Family Medicine

## 2018-11-12 DIAGNOSIS — F411 Generalized anxiety disorder: Secondary | ICD-10-CM

## 2018-11-12 DIAGNOSIS — I10 Essential (primary) hypertension: Secondary | ICD-10-CM

## 2018-11-18 ENCOUNTER — Ambulatory Visit: Payer: BLUE CROSS/BLUE SHIELD | Admitting: Family Medicine

## 2019-01-23 DIAGNOSIS — I10 Essential (primary) hypertension: Secondary | ICD-10-CM

## 2019-01-23 DIAGNOSIS — F411 Generalized anxiety disorder: Secondary | ICD-10-CM

## 2019-01-23 MED ORDER — ATENOLOL 50 MG PO TABS: 50.0000 mg | ORAL_TABLET | Freq: Every day | ORAL | 0 refills | Status: DC

## 2019-01-23 MED ORDER — LOSARTAN POTASSIUM 50 MG PO TABS: 50.0000 mg | ORAL_TABLET | Freq: Every day | ORAL | 0 refills | Status: DC

## 2019-02-15 ENCOUNTER — Other Ambulatory Visit: Payer: Self-pay

## 2019-02-15 ENCOUNTER — Emergency Department: Payer: No Typology Code available for payment source

## 2019-02-15 ENCOUNTER — Encounter: Payer: Self-pay | Admitting: Emergency Medicine

## 2019-02-15 ENCOUNTER — Emergency Department
Admission: EM | Admit: 2019-02-15 | Discharge: 2019-02-15 | Disposition: A | Discharge status: Home / Self Care | Payer: No Typology Code available for payment source | Attending: Emergency Medicine | Admitting: Emergency Medicine

## 2019-02-15 DIAGNOSIS — Z79899 Other long term (current) drug therapy: Secondary | ICD-10-CM | POA: Insufficient documentation

## 2019-02-15 DIAGNOSIS — Z87891 Personal history of nicotine dependence: Secondary | ICD-10-CM | POA: Diagnosis not present

## 2019-02-15 DIAGNOSIS — M79662 Pain in left lower leg: Secondary | ICD-10-CM | POA: Diagnosis present

## 2019-02-15 DIAGNOSIS — I10 Essential (primary) hypertension: Secondary | ICD-10-CM | POA: Diagnosis not present

## 2019-02-15 DIAGNOSIS — M79669 Pain in unspecified lower leg: Secondary | ICD-10-CM

## 2019-02-15 MED ORDER — HYDROCODONE-ACETAMINOPHEN 5-325 MG PO TABS: 1.0000 | ORAL_TABLET | Freq: Four times a day (QID) | ORAL | 0 refills | Status: AC | PRN

## 2019-02-15 MED ORDER — MELOXICAM 15 MG PO TABS: 15.0000 mg | ORAL_TABLET | Freq: Every day | ORAL | 1 refills | Status: AC

## 2019-02-15 MED ORDER — KETOROLAC TROMETHAMINE 30 MG/ML IJ SOLN
30.0000 mg | Freq: Once | INTRAMUSCULAR | Status: AC
  Administered 2019-02-15: 30 mg via INTRAMUSCULAR
  Filled 2019-02-15: qty 1

## 2019-02-15 NOTE — ED Notes (Signed)
US at bedside

## 2019-02-15 NOTE — ED Provider Notes (Signed)
[facility] Emergency Department Provider Note  ____________________________________________  Time seen: Approximately 3:24 PM  I have reviewed the triage vital signs and the nursing notes.   HISTORY  Chief Complaint Leg Pain    HPI Travis Howard is a 43 y.o. male presents to the emergency department with 10 out of 10 acute and aching left calf pain.  Patient describes pain as occurring along the proximal to mid calf.  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  Patient reports that he immediately felt a sharp and stabbing pain and has not been able to bear weight.  Pain is worsened with dorsiflexion at the left ankle and with attempted ambulation.  Pain is improved with rest.  He denies prior traumas or surgeries to the left lower extremity.  He denies prolonged immobilization, recent surgery, daily smoking or prior history of DVT or PE.  No similar injuries in the past.  No other alleviating measures have been attempted.        Past Medical History:  Diagnosis Date  . Anxiety   . GERD (gastroesophageal reflux disease)   . Hyperlipidemia     Patient Active Problem List   Diagnosis Date Noted  . Elevated hemoglobin A1c 05/14/2018  . GERD (gastroesophageal reflux disease) 03/26/2018  . GAD (generalized anxiety disorder) 03/26/2018  . Panic attacks 03/26/2018  . Mixed hyperlipidemia 03/26/2018  . Essential hypertension 03/25/2018  . Suspected sleep apnea 03/25/2018    History reviewed. No pertinent surgical history.  Prior to Admission medications   Medication Sig Start Date End Date Taking? Authorizing Provider  atenolol (TENORMIN) 50 MG tablet Take 1 tablet (50 mg total) by mouth daily. 01/23/19   Karamalegos, Netta Neat, DO  HYDROcodone-acetaminophen (NORCO) 5-325 MG tablet Take 1 tablet by mouth every 6 (six) hours as needed for up to 2 days for moderate pain. 02/15/19 02/17/19   Orvil Feil, PA-C  ibuprofen (ADVIL,MOTRIN) 200 MG tablet Take by mouth. 05/02/18 05/02/19  [provider]  losartan (COZAAR) 50 MG tablet Take 1 tablet (50 mg total) by mouth daily. 01/23/19   Karamalegos, Netta Neat, DO  meloxicam (MOBIC) 15 MG tablet Take 1 tablet (15 mg total) by mouth daily for 7 days. 02/15/19 02/22/19  Orvil Feil, PA-C    Allergies Patient has no known allergies.  Family History  Problem Relation Age of Onset  . Heart disease Father        CABG  . Heart attack Paternal Grandfather 36    Social History Social History   Tobacco Use  . Smoking status: Former Smoker    Packs/day: 1.00    Years: 10.00    Pack years: 10.00    Types: Cigarettes    Quit date: 2002    Years since quitting: 18.8  . Smokeless tobacco: Former Engineer, water Use Topics  . Alcohol use: Yes    Alcohol/week: 14.0 standard drinks    Types: 7 Cans of beer, 7 Shots of liquor per week    Comment: Approx 2 per day  . Drug use: No     Review of Systems  Constitutional: No fever/chills Eyes: No visual changes. No discharge ENT: No upper respiratory complaints. Cardiovascular: no chest pain. Respiratory: no cough. No SOB. Gastrointestinal: No abdominal pain.  No nausea, no vomiting.  No diarrhea.  No constipation. Musculoskeletal: Patient has calf pain.  Skin: Negative for rash, abrasions, lacerations, ecchymosis. Neurological:  Negative for headaches, focal weakness or numbness.  ____________________________________________   PHYSICAL EXAM:  VITAL SIGNS: ED Triage Vitals  Enc Vitals Group     BP 02/15/19 1426 (!) 134/94     Pulse Rate 02/15/19 1426 80     Resp 02/15/19 1426 20     Temp 02/15/19 1426 98.7 F (37.1 C)     Temp Source 02/15/19 1426 Oral     SpO2 02/15/19 1426 96 %     Weight 02/15/19 1428 230 lb (104.3 kg)     Height 02/15/19 1428 6\' 1"  (1.854 m)     Head Circumference --      Peak Flow --      Pain Score 02/15/19 1427 8     Pain Loc --       Pain Edu? --      Excl. in GC? --      Constitutional: Alert and oriented. Well appearing and in no acute distress. Eyes: Conjunctivae are normal. PERRL. EOMI. Head: Atraumatic. Cardiovascular: Normal rate, regular rhythm. Normal S1 and S2.  Good peripheral circulation. Respiratory: Normal respiratory effort without tachypnea or retractions. Lungs CTAB. Good air entry to the bases with no decreased or absent breath sounds. Gastrointestinal: Bowel sounds 4 quadrants. Soft and nontender to palpation. No guarding or rigidity. No palpable masses. No distention. No CVA tenderness. Musculoskeletal: Patient has pain to palpation along the proximal to mid left calf.  Pain is worsened with dorsiflexion at the left ankle.  He is able to perform plantar flexion without discomfort.  He performs full range of motion at the left ankle and left knee.  No pain to palpation over the insertion of the Achilles tendon.  No pain to palpation over the insertion for the left hamstrings.  Palpable dorsalis pedis pulse bilaterally and symmetrically. Neurologic:  Normal speech and language. No gross focal neurologic deficits are appreciated.  Skin:  Skin is warm, dry and intact. No rash noted. Psychiatric: Mood and affect are normal. Speech and behavior are normal. Patient exhibits appropriate insight and judgement.   ____________________________________________   LABS (all labs ordered are listed, but only abnormal results are displayed)  Labs Reviewed - No data to display ____________________________________________  EKG   ____________________________________________  RADIOLOGY I personally viewed and evaluated these images as part of my medical decision making, as well as reviewing the written report by the radiologist.    Dg Tibia/fibula Left  Result Date: 02/15/2019 CLINICAL DATA:  43 year old male with trauma to the left lower extremity. EXAM: LEFT TIBIA AND FIBULA - 2 VIEW COMPARISON:   None. FINDINGS: There is no evidence of fracture or other focal bone lesions. Soft tissues are unremarkable. IMPRESSION: Negative. Electronically Signed   By: Elgie CollardArash  Radparvar M.D.   On: 02/15/2019 15:51   [place] Lt Lower Extrem Ltd Soft Tissue Non Vascular  Result Date: 02/15/2019 CLINICAL DATA:  Left calf pain EXAM: ULTRASOUND LEFT LOWER EXTREMITY LIMITED TECHNIQUE: Ultrasound examination of the lower extremity soft tissues was performed in the area of clinical concern. COMPARISON:  None. FINDINGS: Targeted ultrasound was performed in the area of clinical concern (left calf). No mass is seen. Associated intramuscular fluid is present, suggesting muscle tear/injury. IMPRESSION: Intramuscular fluid in the left posterior calf, corresponding to the site of the patient's pain, nonspecific but suggesting muscle tear/injury. Electronically Signed   By: Charline BillsSriyesh  Krishnan M.D.   On: 02/15/2019 16:28    ____________________________________________    PROCEDURES  Procedure(s) performed:    Procedures  Medications  ketorolac (TORADOL) 30 MG/ML injection 30 mg (30 mg Intramuscular Given 02/15/19 1532)     ____________________________________________   INITIAL IMPRESSION / ASSESSMENT AND PLAN / ED COURSE  Pertinent labs & imaging results that were available during my care of the patient were reviewed by me and considered in my medical decision making (see chart for details).  Review of the [place] CSRS was performed in accordance of the [place] prior to dispensing any controlled drugs.           Assessment and plan Left calf pain 43 year old male presents to the emergency department with acute and aching left calf pain after patient was lifting heavy boxes yesterday.  On physical exam, patient's pain was reproduced with palpation of the proximal to mid left calf and with dorsiflexion passively at left ankle.  Differential diagnosis includes soleus tear/strain, plantaris rupture, hematoma,  bony avulsion...  IM Toradol was given in the emergency department for pain.  Ultrasound of the affected area revealed findings consistent with muscle tear/injury.  Patient declined splinting in the emergency department stating that he wished to follow-up with orthopedics.  Crutches were provided.  Patient was discharged with meloxicam and a short course of Norco.  All patient questions were answered.   ____________________________________________  FINAL CLINICAL IMPRESSION(S) / ED DIAGNOSES  Final diagnoses:  Calf pain      NEW MEDICATIONS STARTED DURING THIS VISIT:  ED Discharge Orders         Ordered    HYDROcodone-acetaminophen (NORCO) 5-325 MG tablet  Every 6 hours PRN     02/15/19 1705    meloxicam (MOBIC) 15 MG tablet  Daily     02/15/19 1705              This chart was dictated using voice recognition software/Dragon. Despite best efforts to proofread, errors can occur which can change the meaning. Any change was purely unintentional.    Lannie Fields, PA-C 02/15/19 1744    Nance Pear, MD 02/15/19 Bosie Helper

## 2019-02-15 NOTE — Discharge Instructions (Signed)
Take Meloxicam daily for pain and inflammation.  Please follow up with Ortho, Dr. Roland Rack.

## 2019-02-15 NOTE — ED Triage Notes (Signed)
L calf pain since lifting heavy object at work yesterday. States works at [employer].

## 2019-02-15 NOTE — ED Notes (Addendum)
Pt reports this is worker comp visit.  He informed RN that [facility] already drug tested him and he is unsure if needs further testing.  Spoke with Education officer, museum, [phone] at [facility] and no further testing needed for worker comp.

## 2019-02-19 ENCOUNTER — Ambulatory Visit (INDEPENDENT_AMBULATORY_CARE_PROVIDER_SITE_OTHER): Payer: Self-pay | Admitting: Family Medicine

## 2019-02-19 ENCOUNTER — Other Ambulatory Visit: Payer: Self-pay | Admitting: Family Medicine

## 2019-02-19 ENCOUNTER — Encounter: Payer: Self-pay | Admitting: Family Medicine

## 2019-02-19 ENCOUNTER — Other Ambulatory Visit: Payer: Self-pay

## 2019-02-19 VITALS — BP 130/89

## 2019-02-19 DIAGNOSIS — F411 Generalized anxiety disorder: Secondary | ICD-10-CM

## 2019-02-19 DIAGNOSIS — I10 Essential (primary) hypertension: Secondary | ICD-10-CM

## 2019-02-19 DIAGNOSIS — R7309 Other abnormal glucose: Secondary | ICD-10-CM

## 2019-02-19 DIAGNOSIS — Z0000 Encounter for general adult medical examination without abnormal findings: Secondary | ICD-10-CM

## 2019-02-19 DIAGNOSIS — E782 Mixed hyperlipidemia: Secondary | ICD-10-CM

## 2019-02-19 MED ORDER — LOSARTAN POTASSIUM 50 MG PO TABS: 50.0000 mg | ORAL_TABLET | Freq: Every day | ORAL | 1 refills | Status: DC

## 2019-02-19 MED ORDER — ATENOLOL 50 MG PO TABS: 50.0000 mg | ORAL_TABLET | Freq: Every day | ORAL | 1 refills | Status: DC

## 2019-02-19 NOTE — Patient Instructions (Addendum)
Thank you for coming to the office today.  Refilled BP meds Atenolol and Losartan - 90 day to pharmacy. Continue for now up to 6 month supply.  If you need any other advice regarding BP medication or readings let me know.  Next physical in 3 months.  DUE for FASTING BLOOD WORK (no food or drink after midnight before the lab appointment, only water or coffee without cream/sugar on the morning of)  SCHEDULE "Lab Only" visit in the morning at the clinic for lab draw in 3 MONTHS   - Make sure Lab Only appointment is at about 1 week before your next appointment, so that results will be available  For Lab Results, once available within 2-3 days of blood draw, you can can log in to MyChart online to view your results and a brief explanation. Also, we can discuss results at next follow-up visit.   Please schedule a Follow-up Appointment to: Return in about 3 months (around 05/22/2019) for Annual Physical.  If you have any other questions or concerns, please feel free to call the office or send a message through [place]. You may also schedule an earlier appointment if necessary.  Additionally, you may be receiving a survey about your experience at our office within a few days to 1 week by e-mail or mail. We value your feedback.  Nobie Putnam, DO [place]

## 2019-02-19 NOTE — Assessment & Plan Note (Signed)
See prior A&P Improved from change of job now less stress On Atenolol, seems to be helping

## 2019-02-19 NOTE — Progress Notes (Signed)
Subjective:    Patient ID: Travis Howard, male    DOB: 1975/10/29, 43 y.o.   MRN: [id number]  Travis Howard is a 43 y.o. male presenting on 02/19/2019 for Hypertension  Virtual / Telehealth Encounter - Telephone  The purpose of this virtual visit is to provide medical care while limiting exposure to the novel coronavirus (COVID19) for both patient and office staff.  Consent was obtained for remote visit:  Yes.   Answered questions that patient had about telehealth interaction:  Yes.   I discussed the limitations, risks, security and privacy concerns of performing an evaluation and management service by video/telephone. I also discussed with the patient that there may be a patient responsible charge related to this service. The patient expressed understanding and agreed to proceed.  Patient Location: Home Provider Location: [facility] (Office)   HPI  CHRONIC HTN: Recent history elevated BP. Uncontrolled HTN. Within past year started on Atenolol dose up from 25 to 50 and also added Losartan 50 with improvement overall Improved home BP readings avg SBP around 130s / DBP can be 80s to 90s Current Meds -Atenolol 50mg  daily, Losartan 50mg  daily Lifestyle: - trying to limit alcohol, improve hydration - goal to restart regular exercise, remains active  GAD Anxiety Improved with new job change less stress. On Atenolol, see above  [facility] 02/19/2019 05/13/2018 03/25/2018  Decreased Interest 0 0 0  Down, Depressed, Hopeless 0 0 0  PHQ - 2 Score 0 0 0  Altered sleeping - 0 3  Tired, decreased energy - 0 3  Change in appetite - 0 1  Feeling bad or failure about yourself  - 0 0  Trouble concentrating - 0 0  Moving slowly or fidgety/restless - 0 2  Suicidal thoughts - 0 0  PHQ-9 Score - 0 9  Difficult doing work/chores - Not difficult at all Somewhat difficult    Social History   Tobacco Use  . Smoking status: Former Smoker    Packs/day: 1.00   Years: 10.00    Pack years: 10.00    Types: Cigarettes    Quit date: 2002    Years since quitting: 18.8  . Smokeless tobacco: Former Engineer, waterUser  Substance Use Topics  . Alcohol use: Yes    Alcohol/week: 14.0 standard drinks    Types: 7 Cans of beer, 7 Shots of liquor per week    Comment: Approx 2 per day  . Drug use: No    Review of Systems Per HPI unless specifically indicated above     Objective:    BP 130/89   Wt Readings from Last 3 Encounters:  02/15/19 230 lb (104.3 kg)  05/13/18 251 lb 9.6 oz (114.1 kg)  03/25/18 253 lb (114.8 kg)    Physical Exam   No exam done today due to telephone virtual visit.  Results for orders placed or performed in visit on 05/06/18  T4, free  Result Value Ref Range   Free T4 1.3 0.8 - 1.8 ng/dL  TSH  Result Value Ref Range   TSH 2.22 0.40 - 4.50 mIU/L  HIV Antibody (routine testing w rflx)  Result Value Ref Range   HIV 1&2 Ab, 4th Generation NON-REACTIVE NON-REACTI  Lipid panel  Result Value Ref Range   Cholesterol 230 (H) <200 mg/dL   HDL 38 (L) >29>40 mg/dL   Triglycerides 518465 (H) <150 mg/dL   LDL Cholesterol (Calc)  mg/dL (calc)   Total CHOL/HDL Ratio 6.1 (H) <5.0 (calc)  Non-HDL Cholesterol (Calc) 192 (H) <130 mg/dL (calc)  COMPLETE METABOLIC PANEL WITH GFR  Result Value Ref Range   Glucose, Bld 125 (H) 65 - 99 mg/dL   BUN 14 7 - 25 mg/dL   Creat 0.95 0.60 - 1.35 mg/dL   GFR, Est Non African American 98 > OR = 60 mL/min/1.71m2   GFR, Est African American 114 > OR = 60 mL/min/1.84m2   BUN/Creatinine Ratio NOT APPLICABLE 6 - 22 (calc)   Sodium 140 135 - 146 mmol/L   Potassium 3.7 3.5 - 5.3 mmol/L   Chloride 106 98 - 110 mmol/L   CO2 23 20 - 32 mmol/L   Calcium 8.9 8.6 - 10.3 mg/dL   Total Protein 6.6 6.1 - 8.1 g/dL   Albumin 4.1 3.6 - 5.1 g/dL   Globulin 2.5 1.9 - 3.7 g/dL (calc)   AG Ratio 1.6 1.0 - 2.5 (calc)   Total Bilirubin 0.4 0.2 - 1.2 mg/dL   Alkaline phosphatase (APISO) 71 40 - 115 U/L   AST 39 10 - 40 U/L   ALT  51 (H) 9 - 46 U/L  CBC with Differential/Platelet  Result Value Ref Range   WBC 4.2 3.8 - 10.8 Thousand/uL   RBC 4.48 4.20 - 5.80 Million/uL   Hemoglobin 14.7 13.2 - 17.1 g/dL   HCT 40.9 38.5 - 50.0 %   MCV 91.3 80.0 - 100.0 fL   MCH 32.8 27.0 - 33.0 pg   MCHC 35.9 32.0 - 36.0 g/dL   RDW 12.7 11.0 - 15.0 %   Platelets 229 140 - 400 Thousand/uL   MPV 11.3 7.5 - 12.5 fL   Neutro Abs 1,987 1,500 - 7,800 cells/uL   Lymphs Abs 1,596 850 - 3,900 cells/uL   Absolute Monocytes 445 200 - 950 cells/uL   Eosinophils Absolute 130 15 - 500 cells/uL   Basophils Absolute 42 0 - 200 cells/uL   Neutrophils Relative % 47.3 %   Total Lymphocyte 38.0 %   Monocytes Relative 10.6 %   Eosinophils Relative 3.1 %   Basophils Relative 1.0 %  Hemoglobin A1c  Result Value Ref Range   Hgb A1c MFr Bld 5.9 (H) <5.7 % of total Hgb   Mean Plasma Glucose 123 (calc)   eAG (mmol/L) 6.8 (calc)      Assessment & Plan:   Problem List Items Addressed This Visit    GAD (generalized anxiety disorder)    See prior A&P Improved from change of job now less stress On Atenolol, seems to be helping      Relevant Medications   atenolol (TENORMIN) 50 MG tablet   Essential hypertension - Primary    Improved BP based on report home readings. Since addition of BP medication Complicated with co morbid anxiety affecting his BP likely    Plan:  1. CONTINUE current Atenolol 50mg  daily + Losartan 50mg  daily - new order refills 90 day rx and +1 refill 2. Encourage improved lifestyle - low sodium diet, regular exercise 3. Continue monitor BP outside office, bring readings to next visit, if persistently >135/85 or new symptoms notify office sooner  F/u sooner if needed, labs at physical upcoming      Relevant Medications   losartan (COZAAR) 50 MG tablet   atenolol (TENORMIN) 50 MG tablet      Meds ordered this encounter  Medications  . losartan (COZAAR) 50 MG tablet    Sig: Take 1 tablet (50 mg total) by mouth  daily.    Dispense:  90 tablet    Refill:  1  . atenolol (TENORMIN) 50 MG tablet    Sig: Take 1 tablet (50 mg total) by mouth daily.    Dispense:  90 tablet    Refill:  1      Follow up plan: Return in about 3 months (around 05/22/2019) for Annual Physical.  Future labs ordered for 05/05/2019  Patient verbalizes understanding with the above medical recommendations including the limitation of remote medical advice.  Specific follow-up and call-back criteria were given for patient to follow-up or seek medical care more urgently if needed.  Total duration of direct patient care provided via telephone: 9 minutes    Saralyn Pilar, DO [facility] 02/19/2019, 2:28 PM

## 2019-02-19 NOTE — Assessment & Plan Note (Signed)
Improved BP based on report home readings. Since addition of BP medication Complicated with co morbid anxiety affecting his BP likely    Plan:  1. CONTINUE current Atenolol 50mg  daily + Losartan 50mg  daily - new order refills 90 day rx and +1 refill 2. Encourage improved lifestyle - low sodium diet, regular exercise 3. Continue monitor BP outside office, bring readings to next visit, if persistently >135/85 or new symptoms notify office sooner  F/u sooner if needed, labs at physical upcoming

## 2019-02-26 DIAGNOSIS — I10 Essential (primary) hypertension: Secondary | ICD-10-CM

## 2019-02-26 DIAGNOSIS — F411 Generalized anxiety disorder: Secondary | ICD-10-CM

## 2019-02-26 MED ORDER — ATENOLOL 50 MG PO TABS: 50.0000 mg | ORAL_TABLET | Freq: Every day | ORAL | 1 refills | Status: DC

## 2019-02-26 MED ORDER — LOSARTAN POTASSIUM 50 MG PO TABS: 50.0000 mg | ORAL_TABLET | Freq: Every day | ORAL | 1 refills | Status: DC

## 2019-05-05 ENCOUNTER — Other Ambulatory Visit: Payer: Self-pay

## 2019-05-09 ENCOUNTER — Other Ambulatory Visit: Payer: Self-pay

## 2019-05-09 ENCOUNTER — Encounter: Payer: Self-pay | Admitting: Family Medicine

## 2019-08-26 ENCOUNTER — Other Ambulatory Visit: Payer: Self-pay | Admitting: Family Medicine

## 2019-08-26 DIAGNOSIS — I10 Essential (primary) hypertension: Secondary | ICD-10-CM

## 2019-08-26 DIAGNOSIS — F411 Generalized anxiety disorder: Secondary | ICD-10-CM

## 2019-08-26 NOTE — Telephone Encounter (Signed)
Requested Prescriptions  Pending Prescriptions Disp Refills  . atenolol (TENORMIN) 50 MG tablet [Pharmacy Med Name: ATENOLOL 50 MG TABLET] 30 tablet 0    Sig: TAKE 1 TABLET BY MOUTH EVERY DAY     Cardiovascular:  Beta Blockers Failed - 08/26/2019  3:09 AM      Failed - Valid encounter within last 6 months    Recent Outpatient Visits          6 months ago Essential hypertension   [facility], Netta Neat, DO   1 year ago Annual physical exam   [facility] Smitty Cords, DO   1 year ago Essential hypertension   [facility] Smitty Cords, DO             Passed - Last BP in normal range    BP Readings from Last 1 Encounters:  02/19/19 130/89         Passed - Last Heart Rate in normal range    Pulse Readings from Last 1 Encounters:  02/15/19 72         . losartan (COZAAR) 50 MG tablet [Pharmacy Med Name: LOSARTAN POTASSIUM 50 MG TAB] 90 tablet 1    Sig: TAKE 1 TABLET BY MOUTH EVERY DAY     Cardiovascular:  Angiotensin Receptor Blockers Failed - 08/26/2019  3:09 AM      Failed - Cr in normal range and within 180 days    Creat  Date Value Ref Range Status  05/06/2018 0.95 0.60 - 1.35 mg/dL Final         Failed - K in normal range and within 180 days    Potassium  Date Value Ref Range Status  05/06/2018 3.7 3.5 - 5.3 mmol/L Final         Failed - Valid encounter within last 6 months    Recent Outpatient Visits          6 months ago Essential hypertension   [facility] Smitty Cords, DO   1 year ago Annual physical exam   [facility] Smitty Cords, DO   1 year ago Essential hypertension   [facility] [place], Netta Neat, [place] - Patient is not pregnant      Passed - Last BP in normal range    BP Readings from Last 1 Encounters:  02/19/19 130/89

## 2019-09-02 DIAGNOSIS — F411 Generalized anxiety disorder: Secondary | ICD-10-CM

## 2019-09-02 DIAGNOSIS — I10 Essential (primary) hypertension: Secondary | ICD-10-CM

## 2019-09-02 MED ORDER — ATENOLOL 50 MG PO TABS: 50.0000 mg | ORAL_TABLET | Freq: Every day | ORAL | 0 refills | Status: AC

## 2019-09-02 MED ORDER — LOSARTAN POTASSIUM 50 MG PO TABS: 50.0000 mg | ORAL_TABLET | Freq: Every day | ORAL | 0 refills | Status: AC

## 2019-09-02 NOTE — Addendum Note (Signed)
Addended by: Smitty Cords on: 09/02/2019 07:00 PM   Modules accepted: Orders

## 2019-09-14 ENCOUNTER — Other Ambulatory Visit: Payer: Self-pay | Admitting: Family Medicine

## 2019-09-14 DIAGNOSIS — I10 Essential (primary) hypertension: Secondary | ICD-10-CM

## 2019-11-25 ENCOUNTER — Other Ambulatory Visit: Payer: Self-pay | Admitting: Family Medicine

## 2019-11-25 DIAGNOSIS — I10 Essential (primary) hypertension: Secondary | ICD-10-CM

## 2019-11-25 DIAGNOSIS — F411 Generalized anxiety disorder: Secondary | ICD-10-CM

## 2020-01-27 IMAGING — US US EXTREM LOW*L* LIMITED
1 series · 14 of 15 positions shown · non-contrast
Comparison: None.

CLINICAL DATA: Left calf pain

EXAM:
ULTRASOUND LEFT LOWER EXTREMITY LIMITED
TECHNIQUE: Ultrasound examination of the lower extremity soft tissues was
performed in the area of clinical concern.

[Series 1: us extrem low*left* limited · 15 acquisitions, 14 frames shown]
[im 1/15]
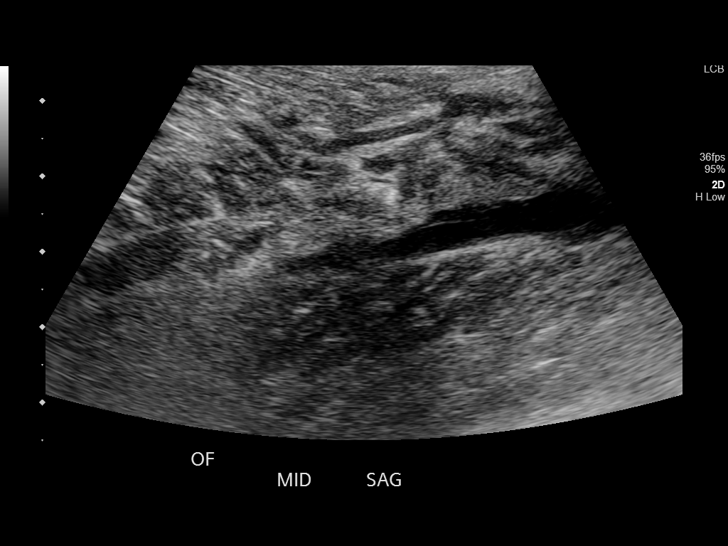
[im 2/15]
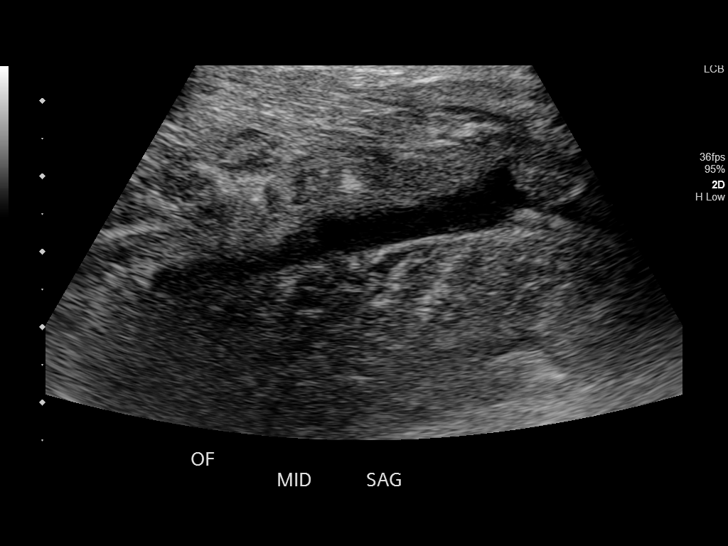
[im 3/15]
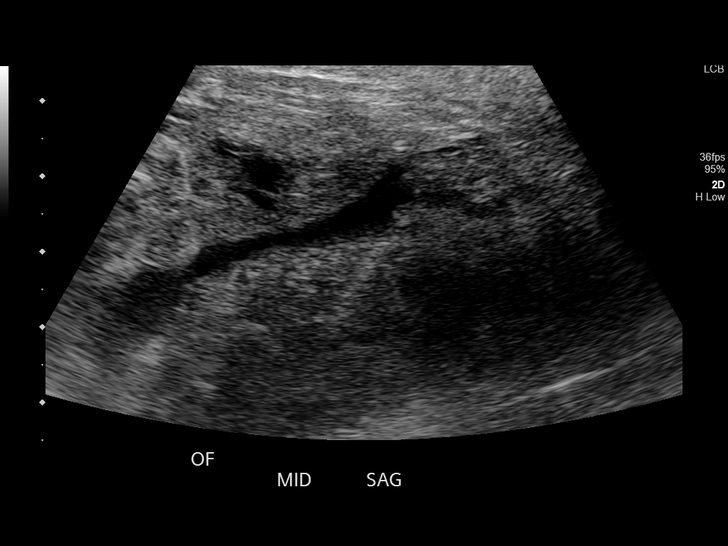
[im 4/15]
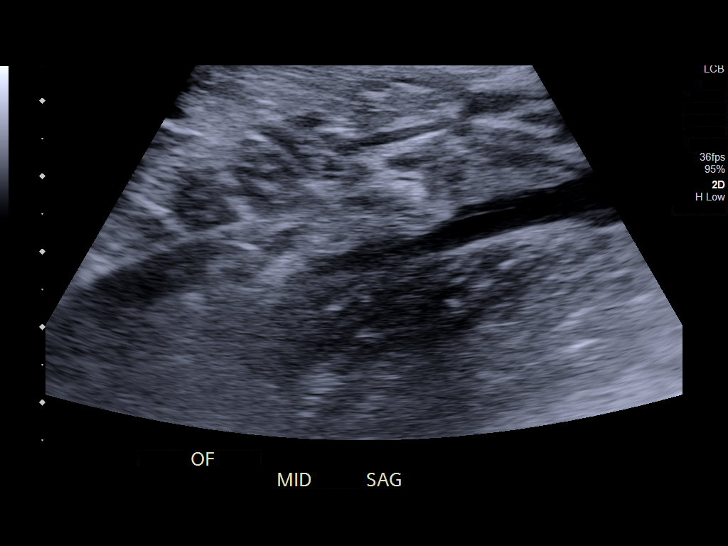
[im 5/15]
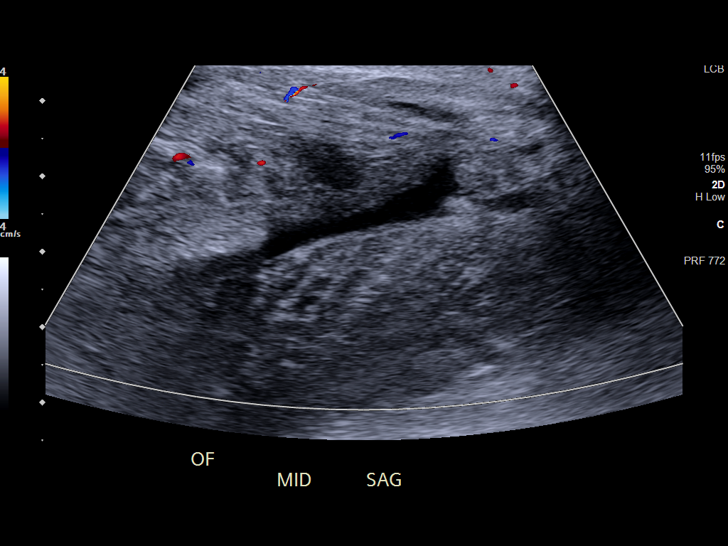
[im 6/15]
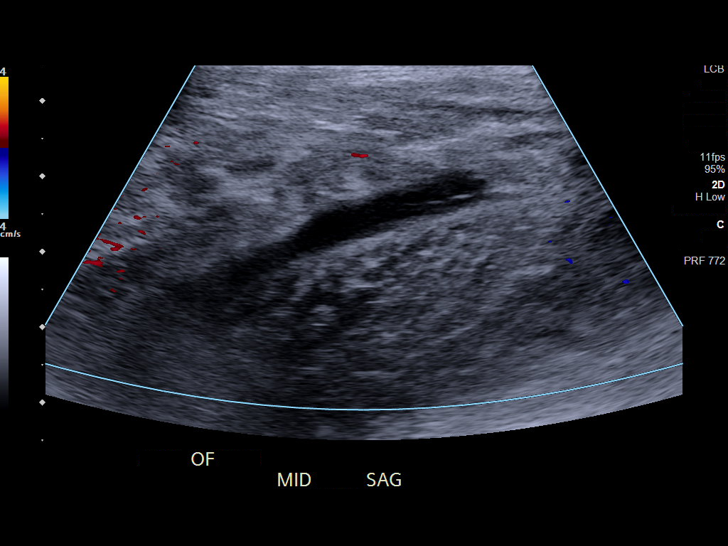
[im 7/15]
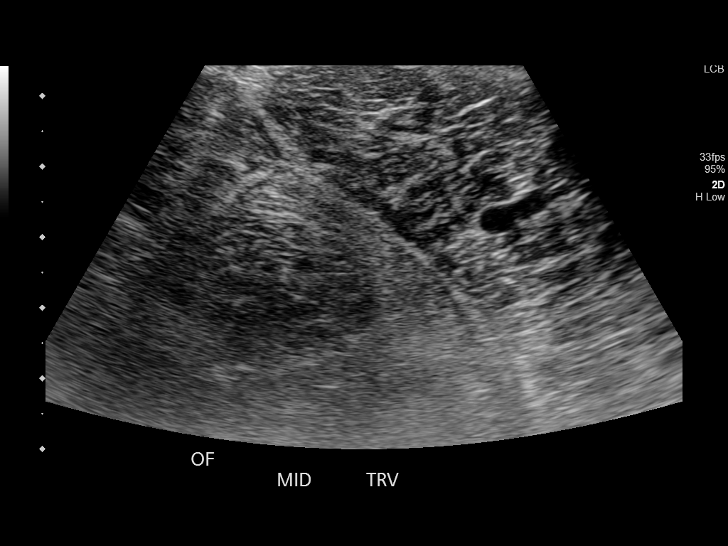
[im 9/15]
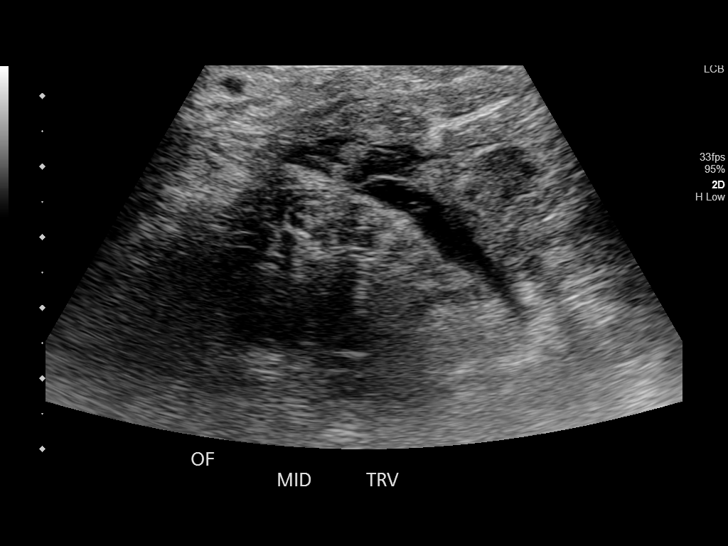
[im 10/15]
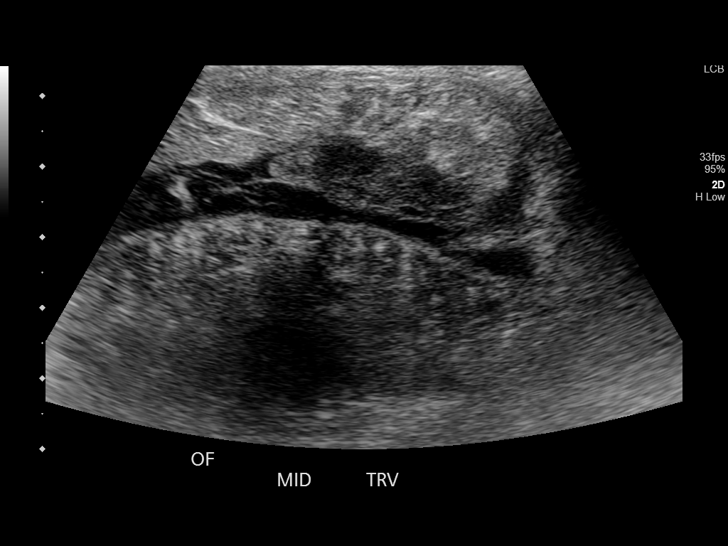
[im 11/15]
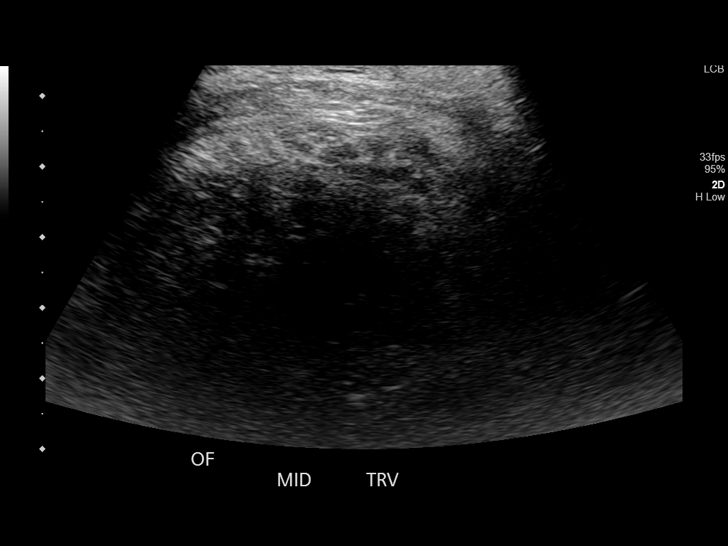
[im 12/15]
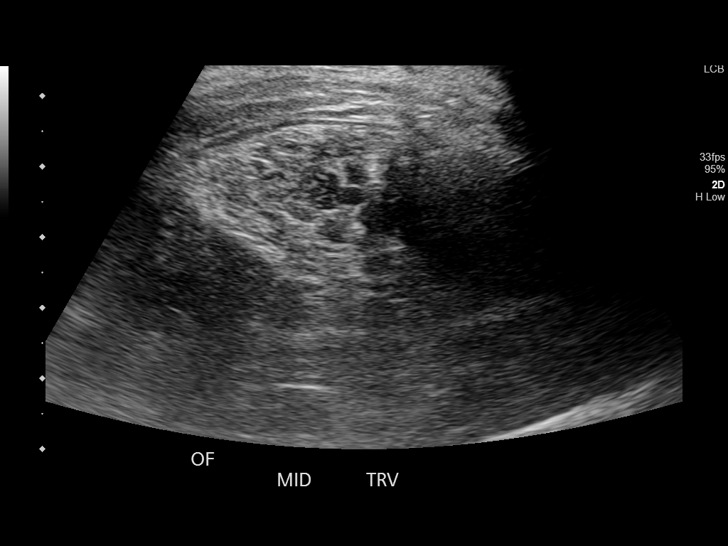
[im 13/15]
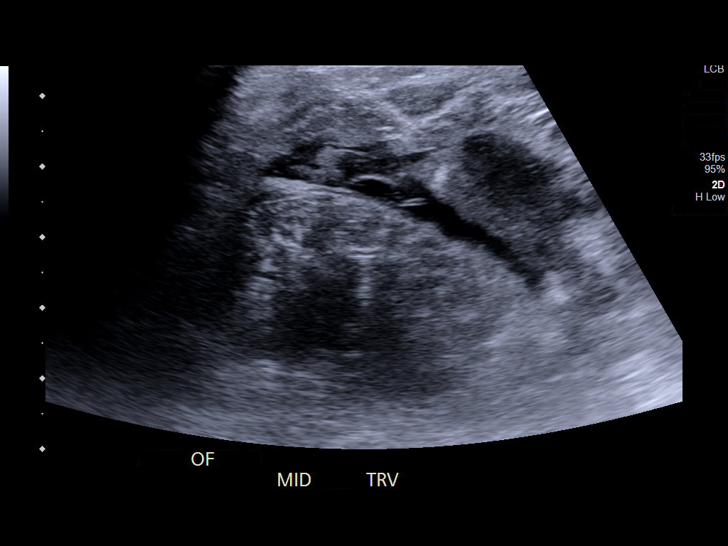
[im 14/15]
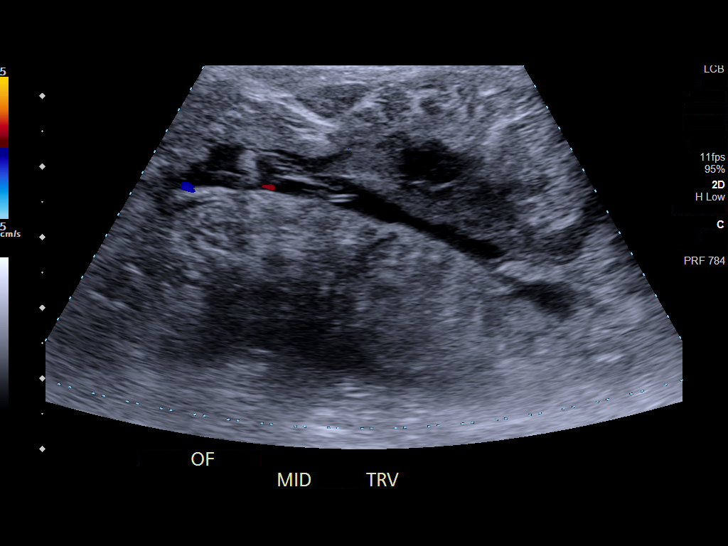
[im 15/15]
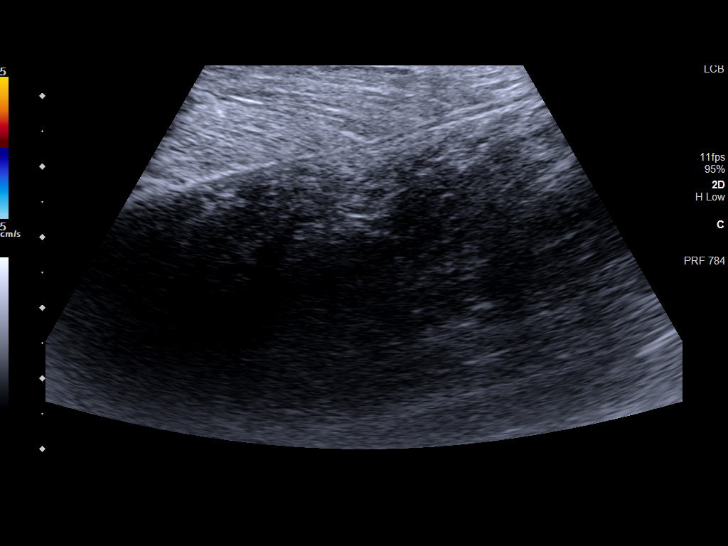

[14 of 15 positions shown; findings below may reference images not displayed]

FINDINGS: Targeted ultrasound was performed in the area of clinical concern
(left calf). No mass is seen. Associated intramuscular fluid is
present, suggesting muscle tear/injury.
IMPRESSION: Intramuscular fluid in the left posterior calf, corresponding to the
site of the patient's pain, nonspecific but suggesting muscle
tear/injury.
